# Patient Record
Sex: Female | Born: 1970 | Race: White | Hispanic: No | Marital: Married | Smoking: Never smoker
Health system: Southern US, Community
[De-identification: ages and names within clinical notes are randomized; demographics above are authoritative.]

## PROBLEM LIST (undated history)

## (undated) DIAGNOSIS — D649 Anemia, unspecified: Secondary | ICD-10-CM

## (undated) DIAGNOSIS — F419 Anxiety disorder, unspecified: Secondary | ICD-10-CM

## (undated) DIAGNOSIS — T7840XA Allergy, unspecified, initial encounter: Secondary | ICD-10-CM

## (undated) HISTORY — PX: POLYPECTOMY: SHX149

## (undated) HISTORY — PX: APPENDECTOMY: SHX54

## (undated) HISTORY — DX: Anemia, unspecified: D64.9

## (undated) HISTORY — DX: Allergy, unspecified, initial encounter: T78.40XA

## (undated) HISTORY — PX: DILATION AND CURETTAGE, DIAGNOSTIC / THERAPEUTIC: SUR384

## (undated) HISTORY — DX: Anxiety disorder, unspecified: F41.9

## (undated) HISTORY — PX: UPPER GASTROINTESTINAL ENDOSCOPY: SHX188

## (undated) HISTORY — PX: COLONOSCOPY: SHX174

---

## 2019-11-06 ENCOUNTER — Encounter: Payer: Self-pay | Admitting: Gastroenterology

## 2019-12-02 ENCOUNTER — Other Ambulatory Visit: Payer: Self-pay

## 2019-12-02 ENCOUNTER — Ambulatory Visit (AMBULATORY_SURGERY_CENTER): Payer: Self-pay

## 2019-12-02 VITALS — Ht 66.0 in | Wt 167.0 lb

## 2019-12-02 DIAGNOSIS — Z1211 Encounter for screening for malignant neoplasm of colon: Secondary | ICD-10-CM

## 2019-12-02 NOTE — Progress Notes (Signed)
No allergies to soy or egg Pt is not on blood thinners or diet pills Denies issues with sedation/intubation Denies atrial flutter/fib Denies constipation   Pt is aware of Covid safety and care partner requirements.      

## 2019-12-17 ENCOUNTER — Encounter: Payer: Self-pay | Admitting: Internal Medicine

## 2019-12-29 ENCOUNTER — Encounter: Payer: Self-pay | Admitting: Gastroenterology

## 2019-12-29 ENCOUNTER — Ambulatory Visit (AMBULATORY_SURGERY_CENTER): Payer: BC Managed Care – PPO | Admitting: Gastroenterology

## 2019-12-29 ENCOUNTER — Other Ambulatory Visit: Payer: Self-pay

## 2019-12-29 VITALS — BP 103/65 | HR 64 | Temp 97.5°F | Resp 16 | Ht 66.0 in | Wt 167.0 lb

## 2019-12-29 DIAGNOSIS — Z1211 Encounter for screening for malignant neoplasm of colon: Secondary | ICD-10-CM | POA: Diagnosis present

## 2019-12-29 DIAGNOSIS — K635 Polyp of colon: Secondary | ICD-10-CM | POA: Diagnosis not present

## 2019-12-29 DIAGNOSIS — D125 Benign neoplasm of sigmoid colon: Secondary | ICD-10-CM | POA: Diagnosis not present

## 2019-12-29 DIAGNOSIS — D120 Benign neoplasm of cecum: Secondary | ICD-10-CM

## 2019-12-29 MED ORDER — SODIUM CHLORIDE 0.9 % IV SOLN: 500.0000 mL | Freq: Once | INTRAVENOUS | Status: DC

## 2019-12-29 NOTE — Op Note (Signed)
[facility] Patient Name: Jackie Pollard Procedure Date: 12/29/2019 10:32 AM MRN: [id number] Endoscopist: Justice Britain , MD Age: 49 Referring MD:  Date of Birth: 1970-09-23 Gender: Female Account #: [id number] Procedure:                Colonoscopy Indications:              Screening for colorectal malignant neoplasm (PGM                            with early colon cancer - only 1 second degree                            relative), This is the patient's first colonoscopy Medicines:                Monitored Anesthesia Care Procedure:                Pre-Anesthesia Assessment:                           - Prior to the procedure, a History and Physical                            was performed, and patient medications and                            allergies were reviewed. The patient's tolerance of                            previous anesthesia was also reviewed. The risks                            and benefits of the procedure and the sedation                            options and risks were discussed with the patient.                            All questions were answered, and informed consent                            was obtained. Prior Anticoagulants: The patient has                            taken no previous anticoagulant or antiplatelet                            agents except for NSAID medication. ASA Grade                            Assessment: II - A patient with mild systemic                            disease. After reviewing the risks and benefits,  the patient was deemed in satisfactory condition to                            undergo the procedure.                           After obtaining informed consent, the colonoscope                            was passed under direct vision. Throughout the                            procedure, the patient's blood pressure, pulse, and                            oxygen saturations were  monitored continuously. The                            Olympus PFC-[id number] (#[id number]) Colonoscope was                            introduced through the anus and advanced to the the                            cecum, identified by appendiceal orifice and                            ileocecal valve. The colonoscopy was performed                            without difficulty. The patient tolerated the                            procedure. The quality of the bowel preparation was                            adequate. The ileocecal valve, appendiceal orifice,                            and rectum were photographed. Scope In: 11:41:02 AM Scope Out: 12:05:06 PM Scope Withdrawal Time: 0 hours 17 minutes 16 seconds  Total Procedure Duration: 0 hours 24 minutes 4 seconds  Findings:                 The digital rectal exam findings include                            hemorrhoids. Pertinent negatives include no                            palpable rectal lesions.                           A moderate amount of semi-liquid stool was found in  the entire colon, interfering with visualization.                            Lavage of the area was performed using copious                            amounts, resulting in clearance with adequate                            visualization.                           A 20 mm polyp was found in the cecum with a mucous                            cap. The polyp was sessile. The polyp was removed                            with a piecemeal technique using a cold snare.                            Resection and retrieval were complete.                           A 8 mm polyp was found in the sigmoid colon. The                            polyp was sessile. The polyp was removed with a                            cold snare. Resection and retrieval were complete.                           Normal mucosa was found in the entire colon                             otherwise.                           Non-bleeding non-thrombosed external and internal                            hemorrhoids were found during retroflexion, during                            perianal exam and during digital exam. The                            hemorrhoids were Grade II (internal hemorrhoids                            that prolapse but reduce spontaneously). Complications:            No immediate complications. Estimated Blood Loss:     Estimated blood  loss was minimal. Impression:               - Hemorrhoids found on digital rectal exam.                           - Stool in the entire examined colon. Lavaged                            copiously with adequate visualization.                           - One 20 mm polyp in the cecum, removed piecemeal                            using a cold snare. Resected and retrieved.                           - One 8 mm polyp in the sigmoid colon, removed with                            a cold snare. Resected and retrieved.                           - Normal mucosa in the entire examined colon                            otherwise.                           - Non-bleeding non-thrombosed external and internal                            hemorrhoids. Recommendation:           - The patient will be observed post-procedure,                            until all discharge criteria are met.                           - Discharge patient to home.                           - Patient has a contact number available for                            emergencies. The signs and symptoms of potential                            delayed complications were discussed with the                            patient. Return to normal activities tomorrow.                            Written discharge instructions were provided  to the                            patient.                           - High fiber diet.                           - Use FiberCon 1-2 tablets PO  daily.                           - Continue present medications.                           - Await pathology results.                           - Repeat colonoscopy in 1 year for surveillance                            after piecemeal polypectomy of larger cecal polyp.                           - The findings and recommendations were discussed                            with the patient.                           - The findings and recommendations were discussed                            with the patient's family. Justice Britain, MD 12/29/2019 12:12:25 PM

## 2019-12-29 NOTE — Progress Notes (Signed)
Called to room to assist during endoscopic procedure.  Patient ID and intended procedure confirmed with present staff. Received instructions for my participation in the procedure from the performing physician  .Patient consents to observer being present for procedure.   Victoria McReynolds, student 

## 2019-12-29 NOTE — Patient Instructions (Signed)
Handouts provided:  Polyps and High Fiber Diet  Use FiberCon 1-2 tablets by mouth daily   YOU HAD AN ENDOSCOPIC PROCEDURE TODAY AT THE [facility]:   Refer to the procedure report that was given to you for any specific questions about what was found during the examination.  If the procedure report does not answer your questions, please call your gastroenterologist to clarify.  If you requested that your care partner not be given the details of your procedure findings, then the procedure report has been included in a sealed envelope for you to review at your convenience later.  YOU SHOULD EXPECT: Some feelings of bloating in the abdomen. Passage of more gas than usual.  Walking can help get rid of the air that was put into your GI tract during the procedure and reduce the bloating. If you had a lower endoscopy (such as a colonoscopy or flexible sigmoidoscopy) you may notice spotting of blood in your stool or on the toilet paper. If you underwent a bowel prep for your procedure, you may not have a normal bowel movement for a few days.  Please Note:  You might notice some irritation and congestion in your nose or some drainage.  This is from the oxygen used during your procedure.  There is no need for concern and it should clear up in a day or so.  SYMPTOMS TO REPORT IMMEDIATELY:   Following lower endoscopy (colonoscopy or flexible sigmoidoscopy):  Excessive amounts of blood in the stool  Significant tenderness or worsening of abdominal pains  Swelling of the abdomen that is new, acute  Fever of 100F or higher  For urgent or emergent issues, a gastroenterologist can be reached at any hour by calling [phone]. Do not use MyChart messaging for urgent concerns.    DIET:  We do recommend a small meal at first, but then you may proceed to your regular diet.  Drink plenty of fluids but you should avoid alcoholic beverages for 24 hours.  ACTIVITY:  You should plan to take it easy  for the rest of today and you should NOT DRIVE or use heavy machinery until tomorrow (because of the sedation medicines used during the test).    FOLLOW UP: Our staff will call the number listed on your records 48-72 hours following your procedure to check on you and address any questions or concerns that you may have regarding the information given to you following your procedure. If we do not reach you, we will leave a message.  We will attempt to reach you two times.  During this call, we will ask if you have developed any symptoms of COVID 19. If you develop any symptoms (ie: fever, flu-like symptoms, shortness of breath, cough etc.) before then, please call [phone].  If you test positive for Covid 19 in the 2 weeks post procedure, please call and report this information to [place].    If any biopsies were taken you will be contacted by phone or by letter within the next 1-3 weeks.  Please call us at [phone] if you have not heard about the biopsies in 3 weeks.    SIGNATURES/CONFIDENTIALITY: You and/or your care partner have signed paperwork which will be entered into your electronic medical record.  These signatures attest to the fact that that the information above on your After Visit Summary has been reviewed and is understood.  Full responsibility of the confidentiality of this discharge information lies with you and/or your care-partner.

## 2019-12-29 NOTE — Progress Notes (Signed)
Patient consents to observer being present for procedure.   

## 2019-12-29 NOTE — Progress Notes (Signed)
A/ox3, pleased with MAC, report to RN 

## 2019-12-29 NOTE — Progress Notes (Signed)
Pt's states no medical or surgical changes since previsit or office visit. 

## 2019-12-30 ENCOUNTER — Telehealth: Payer: Self-pay

## 2019-12-30 NOTE — Telephone Encounter (Signed)
Left message on 2nd follow up call. 

## 2019-12-30 NOTE — Telephone Encounter (Signed)
Attempted to contact patient, left message regarding follow up of procedure. LN 

## 2020-01-10 ENCOUNTER — Encounter: Payer: Self-pay | Admitting: Gastroenterology

## 2020-05-17 ENCOUNTER — Other Ambulatory Visit: Payer: Self-pay | Admitting: Family Medicine

## 2020-05-17 DIAGNOSIS — R1011 Right upper quadrant pain: Secondary | ICD-10-CM

## 2020-05-18 ENCOUNTER — Ambulatory Visit (HOSPITAL_BASED_OUTPATIENT_CLINIC_OR_DEPARTMENT_OTHER)
Admission: RE | Admit: 2020-05-18 | Discharge: 2020-05-18 | Disposition: A | Discharge status: Home / Self Care | Payer: 59 | Source: Ambulatory Visit | Attending: Family Medicine | Admitting: Family Medicine

## 2020-05-18 ENCOUNTER — Other Ambulatory Visit: Payer: Self-pay

## 2020-05-18 DIAGNOSIS — R1011 Right upper quadrant pain: Secondary | ICD-10-CM | POA: Insufficient documentation

## 2020-05-20 ENCOUNTER — Encounter: Payer: Self-pay | Admitting: Gastroenterology

## 2020-05-20 ENCOUNTER — Ambulatory Visit: Payer: 59 | Admitting: Gastroenterology

## 2020-05-20 ENCOUNTER — Other Ambulatory Visit (INDEPENDENT_AMBULATORY_CARE_PROVIDER_SITE_OTHER): Payer: 59

## 2020-05-20 VITALS — BP 110/70 | HR 72 | Ht 65.35 in | Wt 170.4 lb

## 2020-05-20 DIAGNOSIS — K5909 Other constipation: Secondary | ICD-10-CM | POA: Diagnosis not present

## 2020-05-20 DIAGNOSIS — Z86010 Personal history of colonic polyps: Secondary | ICD-10-CM

## 2020-05-20 DIAGNOSIS — Z862 Personal history of diseases of the blood and blood-forming organs and certain disorders involving the immune mechanism: Secondary | ICD-10-CM

## 2020-05-20 DIAGNOSIS — R110 Nausea: Secondary | ICD-10-CM | POA: Diagnosis not present

## 2020-05-20 DIAGNOSIS — R197 Diarrhea, unspecified: Secondary | ICD-10-CM

## 2020-05-20 DIAGNOSIS — D649 Anemia, unspecified: Secondary | ICD-10-CM

## 2020-05-20 DIAGNOSIS — R1011 Right upper quadrant pain: Secondary | ICD-10-CM | POA: Diagnosis not present

## 2020-05-20 DIAGNOSIS — Z860101 Personal history of adenomatous and serrated colon polyps: Secondary | ICD-10-CM

## 2020-05-20 LAB — COMPREHENSIVE METABOLIC PANEL
ALT: 11 U/L (ref 0–35)
AST: 15 U/L (ref 0–37)
Albumin: 4.3 g/dL (ref 3.5–5.2)
Alkaline Phosphatase: 47 U/L (ref 39–117)
BUN: 11 mg/dL (ref 6–23)
CO2: 27 mEq/L (ref 19–32)
Calcium: 9.7 mg/dL (ref 8.4–10.5)
Chloride: 101 mEq/L (ref 96–112)
Creatinine, Ser: 0.82 mg/dL (ref 0.40–1.20)
GFR: 84.66 mL/min (ref >60.00)
Glucose, Bld: 86 mg/dL (ref 70–99)
Potassium: 4.2 mEq/L (ref 3.5–5.1)
Sodium: 137 mEq/L (ref 135–145)
Total Bilirubin: 0.2 mg/dL (ref 0.2–1.2)
Total Protein: 7.5 g/dL (ref 6.0–8.3)

## 2020-05-20 LAB — LIPASE: Lipase: 25 U/L (ref 11.0–59.0)

## 2020-05-20 LAB — CBC
HCT: 40.5 % (ref 36.0–46.0)
Hemoglobin: 13.7 g/dL (ref 12.0–15.0)
MCHC: 33.8 g/dL (ref 30.0–36.0)
MCV: 86.9 fl (ref 78.0–100.0)
Platelets: 296 10*3/uL (ref 150.0–400.0)
RBC: 4.66 Mil/uL (ref 3.87–5.11)
RDW: 13.2 % (ref 11.5–15.5)
WBC: 7.7 10*3/uL (ref 4.0–10.5)

## 2020-05-20 LAB — VITAMIN B12: Vitamin B-12: 917 pg/mL — ABNORMAL HIGH (ref 211–911)

## 2020-05-20 LAB — AMYLASE: Amylase: 39 U/L (ref 27–131)

## 2020-05-20 LAB — IBC + FERRITIN
Ferritin: 13.9 ng/mL (ref 10.0–291.0)
Iron: 81 ug/dL (ref 42–145)
Saturation Ratios: 16.7 % — ABNORMAL LOW (ref 20.0–50.0)
Transferrin: 347 mg/dL (ref 212.0–360.0)

## 2020-05-20 LAB — FOLATE: Folate: >24.4 ng/mL (ref >5.9)

## 2020-05-20 MED ORDER — ESOMEPRAZOLE MAGNESIUM 40 MG PO CPDR: 40.0000 mg | DELAYED_RELEASE_CAPSULE | Freq: Every day | ORAL | 3 refills | Status: DC

## 2020-05-20 NOTE — Progress Notes (Signed)
[place] VISIT   Primary Care Provider Waldemar Dickens, MD [place] [address] [phone]  Patient Profile: Jackie Pollard is a 50 y.o. female with a pmh significant for allergies, anxiety, chronic back pain, chronic constipation, colon polyps (SSP/TA).  The patient presents to the [facility] for an evaluation and management of problem(s) noted below:  Problem List 1. RUQ abdominal pain   2. Nausea without vomiting   3. Acute diarrhea   4. Chronic constipation   5. History of anemia   6. Hx of adenomatous colonic polyps     History of Present Illness This is a patient that I met for a screening colonoscopy in December 2021.  She was found to have multiple polyps including a large SSP in the cecum removed in piecemeal fashion and a TA of the sigmoid colon.  We recommended a 1 year follow-up due to the fragmented nature of the large SSP and she will be due for that later this year.  The patient has a longstanding history of constipation.  The patient has history of NSAID use though not on a regular basis.  She had a fatty/rich meal last Friday and subsequently developed abdominal pain and nausea with some vomiting.  Vomiting has subsided but nausea has persisted on a daily basis.  Pain is sharp and stabbing.  It is aggravated by food intake.  She has not taken anything for the pain other than Zofran that was prescribed by her PCP.  No laboratories were performed when she was evaluated by PCP.  She has not been taking significant nonsteroidals over the course the last week.  Pain located in right upper quadrant and nonradiating.  She has never had an upper endoscopy.  Right upper quadrant ultrasound was recently performed couple days ago and unremarkable for gallbladder/biliary pathology.  GI Review of Systems Positive as above including bloating, early satiety, anorexia Negative for pyrosis, dysphagia,  odynophagia, melena, hematochezia  Review of Systems General: Denies fevers/chills/weight loss unintentionally (though she has not checked her weight in the last week) HEENT: Denies oral lesions Cardiovascular: Denies chest pain/palpitations Pulmonary: Denies shortness of breath Gastroenterological: See HPI Genitourinary: Denies darkened urine or hematuria Hematological: Denies easy bruising/bleeding Endocrine: Denies temperature intolerance Dermatological: Denies jaundice Psychological: Mood is anxious to get better   Medications Current Outpatient Medications  Medication Sig Dispense Refill  . diclofenac (VOLTAREN) 75 MG EC tablet Take 75 mg by mouth 2 (two) times daily as needed.    Marland Kitchen escitalopram (LEXAPRO) 10 MG tablet Take 1 tablet by mouth daily.    Marland Kitchen esomeprazole (NEXIUM) 40 MG capsule Take 1 capsule (40 mg total) by mouth daily at 12 noon. 30 capsule 3  . Fexofenadine HCl (ALLEGRA PO) Take 1 tablet by mouth daily.    . Multiple Vitamin (MULTIVITAMIN PO) Take 1 tablet by mouth daily.    Marland Kitchen NIKKI 3-0.02 MG tablet Take 1 tablet by mouth daily.    . ondansetron (ZOFRAN-ODT) 4 MG disintegrating tablet Take 1 tablet by mouth as needed.     No current facility-administered medications for this visit.    Allergies No Known Allergies  Histories History reviewed. No pertinent past medical history. Past Surgical History:  Procedure Laterality Date  . APPENDECTOMY    . DILATION AND CURETTAGE, DIAGNOSTIC / THERAPEUTIC     Social History   Socioeconomic History  . Marital status: Married    Spouse name: Not on file  .  Number of children: Not on file  . Years of education: Not on file  . Highest education level: Not on file  Occupational History  . Not on file  Tobacco Use  . Smoking status: Never Smoker  . Smokeless tobacco: Never Used  Vaping Use  . Vaping Use: Never used  Substance and Sexual Activity  . Alcohol use: Yes    Comment: occ  . Drug use: Never  .  Sexual activity: Not on file  Other Topics Concern  . Not on file  Social History Narrative  . Not on file   Social Determinants of Health   Financial Resource Strain: Not on file  Food Insecurity: Not on file  Transportation Needs: Not on file  Physical Activity: Not on file  Stress: Not on file  Social Connections: Not on file  Intimate Partner Violence: Not on file   Family History  Problem Relation Age of Onset  . Colon cancer Paternal Grandmother 38  . Colon polyps Neg Hx   . Esophageal cancer Neg Hx   . Stomach cancer Neg Hx   . Inflammatory bowel disease Neg Hx   . Liver disease Neg Hx   . Pancreatic cancer Neg Hx    I have reviewed her medical, social, and family history in detail and updated the electronic medical record as necessary.    PHYSICAL EXAMINATION  BP 110/70 (BP Location: Left Arm, Patient Position: Sitting, Cuff Size: Normal)   Pulse 72   Ht 5' 5.35" (1.66 m) Comment: height measured without shoes  Wt 170 lb 6 oz (77.3 kg)   BMI 28.05 kg/m  Wt Readings from Last 3 Encounters:  05/20/20 170 lb 6 oz (77.3 kg)  12/29/19 167 lb (75.8 kg)  12/02/19 167 lb (75.8 kg)  GEN: NAD, appears stated age, doesn't appear chronically ill PSYCH: Cooperative, without pressured speech EYE: Conjunctivae pink, sclerae anicteric ENT: MMM, without oral ulcers, no erythema or exudates noted CV: RR without R/Gs  RESP: CTAB posteriorly, without wheezing GI: NABS, soft, tenderness to palpation in right upper quadrant/mid epigastrium, volitional guarding present, no rebound, unable to appreciate a mass or megaly  MSK/EXT: No lower extremity edema SKIN: No jaundice NEURO:  Alert & Oriented x 3, no focal deficits   REVIEW OF DATA  I reviewed the following data at the time of this encounter:  GI Procedures and Studies  December 2021 colonoscopy - Hemorrhoids found on digital rectal exam. - Stool in the entire examined colon. Lavaged copiously with adequate  visualization. - One 20 mm polyp in the cecum, removed piecemeal using a cold snare. Resected and retrieved. - One 8 mm polyp in the sigmoid colon, removed with a cold snare. Resected and retrieved. - Normal mucosa in the entire examined colon otherwise. - Non-bleeding non-thrombosed external and internal hemorrhoids.  Pathology Diagnosis 1. Surgical [P], colon, cecum, polyp (1) - MULTIPLE FRAGMENTS OF SESSILE SERRATED POLYP(S) - NO HIGH GRADE DYSPLASIA OR MALIGNANCY IDENTIFIED 2. Surgical [P], colon, sigmoid, polyp (1) - TUBULAR ADENOMA (1 OF 1 FRAGMENTS) - NO HIGH GRADE DYSPLASIA OR MALIGNANCY IDENTIFIED  Laboratory Studies  Reviewed those in epic and care everywhere  Imaging Studies  Right upper quadrant ultrasound IMPRESSION: Normal right upper quadrant ultrasound.   ASSESSMENT  Ms. Goodloe is a 50 y.o. female with a pmh significant for allergies, anxiety, chronic back pain, chronic constipation, colon polyps (SSP/TA).  The patient is seen today for evaluation and management of:  1. RUQ abdominal pain   2.  Nausea without vomiting   3. Acute diarrhea   4. Chronic constipation   5. History of anemia   6. Hx of adenomatous colonic polyps    The patient is hemodynamically stable.  Clinically however she is not doing well.  Etiology of her symptoms and acuteness of symptomatology suggests some sort of viral/microbial gastroenteritis.  She is at risk of peptic ulcer disease in the setting of her not infrequent NSAID use.  Gallbladder pathology seems to be less likely though LFTs have not been obtained.  We will perform laboratory evaluation to rule out common etiologies of symptoms.  I will initiate the patient on PPI therapy in both a diagnostic and therapeutic trial.  We will tentatively schedule her for CT abdomen pelvis next week though if she improves clinically with PPI therapy then we may not need the CT scan.  Diagnostic endoscopy will likely also need to be considered pending  on how she does.  If she has severe pain and cannot tolerated and/or is not eating or drinking and having worsened issues then she will need to be evaluated in the emergency department.  All patient questions were answered to the best of my ability, and the patient agrees to the aforementioned plan of action with follow-up as indicated.   PLAN  Laboratories as outlined below Initiate Nexium 40 mg daily CT abdomen/pelvis to be scheduled for next week Diagnostic endoscopy will be considered based on how patient is doing Pain progresses significantly and cannot eat/drink then we will need to be evaluated urgency department Colonoscopy for surveillance of prior large SSP status post piecemeal resection due by December 2022   Orders Placed This Encounter  Procedures  . CT ABDOMEN PELVIS WO CONTRAST  . CBC  . Comprehensive metabolic panel  . Amylase  . Lipase  . IBC + Ferritin  . Vitamin B12  . Folate  . Tissue transglutaminase, IgA  . IgA    New Prescriptions   ESOMEPRAZOLE (NEXIUM) 40 MG CAPSULE    Take 1 capsule (40 mg total) by mouth daily at 12 noon.   Modified Medications   No medications on file    Planned Follow Up No follow-ups on file.   Total Time in Face-to-Face and in Coordination of Care for patient including independent/personal interpretation/review of prior testing, medical history, examination, medication adjustment, communicating results with the patient directly, and documentation with the EHR is 25 minutes.   Justice Britain, MD [facility] Office # [phone]

## 2020-05-20 NOTE — Patient Instructions (Signed)
Your provider has requested that you go to the basement level for lab work before leaving today. Press "B" on the elevator. The lab is located at the first door on the left as you exit the elevator.  We have sent the following medications to your pharmacy for you to pick up at your convenience: Nexium 40 mg daily.   Please contact our office via my chart message on Monday with an update on your symptoms.   You have been scheduled for a CT scan of the abdomen and pelvis at [facility] ([address]---this is in the same building as [employer]).   You are scheduled on 05/26/20 at 9:30am. You should arrive 15 minutes prior to your appointment time for registration. Please follow the written instructions below on the day of your exam:  WARNING: IF YOU ARE ALLERGIC TO IODINE/X-RAY DYE, PLEASE NOTIFY RADIOLOGY IMMEDIATELY AT [phone]! YOU WILL BE GIVEN A 13 HOUR PREMEDICATION PREP.  1) Do not eat anything after 5:30am (4 hours prior to your test) 2) You have been given 2 bottles of oral contrast to drink. The solution may taste better if refrigerated, but do NOT add ice or any other liquid to this solution. Shake well before drinking.    Drink 1 bottle of contrast @ 7:30am (2 hours prior to your exam)  Drink 1 bottle of contrast @ 8:30am (1 hour prior to your exam)  You may take any medications as prescribed with a small amount of water, if necessary. If you take any of the following medications: METFORMIN, GLUCOPHAGE, GLUCOVANCE, AVANDAMET, RIOMET, FORTAMET, [place] MET, JANUMET, GLUMETZA or METAGLIP, you MAY be asked to HOLD this medication 48 hours AFTER the exam.  The purpose of you drinking the oral contrast is to aid in the visualization of your intestinal tract. The contrast solution may cause some diarrhea. Depending on your individual set of symptoms, you may also receive an intravenous injection of x-ray contrast/dye. Plan on being at [facility] for 30  minutes or longer, depending on the type of exam you are having performed.  This test typically takes 30-45 minutes to complete.  If you have any questions regarding your exam or if you need to reschedule, you may call the CT department at [phone] between the hours of 8:00 am and 5:00 pm, Monday-Friday.  ___________________________________________________________

## 2020-05-23 ENCOUNTER — Other Ambulatory Visit: Payer: Self-pay

## 2020-05-23 DIAGNOSIS — R1011 Right upper quadrant pain: Secondary | ICD-10-CM

## 2020-05-23 DIAGNOSIS — R110 Nausea: Secondary | ICD-10-CM

## 2020-05-23 LAB — TISSUE TRANSGLUTAMINASE, IGA: (tTG) Ab, IgA: <1 U/mL

## 2020-05-23 LAB — IGA: Immunoglobulin A: 87 mg/dL (ref 47–310)

## 2020-05-23 NOTE — Telephone Encounter (Signed)
I have reviewed the patient's MyChart message. We will move forward with our planned CT abdomen/pelvis which is scheduled for later this week. We will also go ahead and order a HIDA scan to evaluate for gallbladder dyskinesia. Patient will need to come in for repeat hepatic function panel/amylase/lipase within the next 24 hours to see if any changes have occurred. We also will move forward with getting scheduled an upper endoscopy in the course of the next few weeks depending on what the findings are. Certainly if there is evidence of liver test abnormalities or pancreatitis or her imaging studies suggest a different etiology for her symptoms we may not need endoscopy but we will see.  Patty, please work on setting up the HIDA scan and the labs.  I have replied to her on my chart.  Thanks. GM

## 2020-05-24 ENCOUNTER — Other Ambulatory Visit (INDEPENDENT_AMBULATORY_CARE_PROVIDER_SITE_OTHER): Payer: 59

## 2020-05-24 DIAGNOSIS — R110 Nausea: Secondary | ICD-10-CM | POA: Diagnosis not present

## 2020-05-24 DIAGNOSIS — R1011 Right upper quadrant pain: Secondary | ICD-10-CM

## 2020-05-24 LAB — HEPATIC FUNCTION PANEL
ALT: 9 U/L (ref 0–35)
AST: 13 U/L (ref 0–37)
Albumin: 4.2 g/dL (ref 3.5–5.2)
Alkaline Phosphatase: 44 U/L (ref 39–117)
Bilirubin, Direct: 0.1 mg/dL (ref 0.0–0.3)
Total Bilirubin: 0.4 mg/dL (ref 0.2–1.2)
Total Protein: 7.2 g/dL (ref 6.0–8.3)

## 2020-05-24 LAB — LIPASE: Lipase: 31 U/L (ref 11.0–59.0)

## 2020-05-24 LAB — AMYLASE: Amylase: 38 U/L (ref 27–131)

## 2020-05-26 ENCOUNTER — Other Ambulatory Visit: Payer: Self-pay

## 2020-05-26 ENCOUNTER — Ambulatory Visit (INDEPENDENT_AMBULATORY_CARE_PROVIDER_SITE_OTHER)
Admission: RE | Admit: 2020-05-26 | Discharge: 2020-05-26 | Disposition: A | Discharge status: Home / Self Care | Payer: 59 | Source: Ambulatory Visit | Attending: Gastroenterology | Admitting: Gastroenterology

## 2020-05-26 DIAGNOSIS — R1011 Right upper quadrant pain: Secondary | ICD-10-CM

## 2020-05-26 DIAGNOSIS — R110 Nausea: Secondary | ICD-10-CM

## 2020-06-01 ENCOUNTER — Other Ambulatory Visit: Payer: Self-pay | Admitting: Family Medicine

## 2020-06-01 DIAGNOSIS — H93A1 Pulsatile tinnitus, right ear: Secondary | ICD-10-CM

## 2020-06-01 DIAGNOSIS — G441 Vascular headache, not elsewhere classified: Secondary | ICD-10-CM

## 2020-06-10 ENCOUNTER — Other Ambulatory Visit: Payer: Self-pay

## 2020-06-10 ENCOUNTER — Encounter (HOSPITAL_COMMUNITY)
Admission: RE | Admit: 2020-06-10 | Discharge: 2020-06-10 | Disposition: A | Discharge status: Home / Self Care | Payer: 59 | Source: Ambulatory Visit | Attending: Gastroenterology | Admitting: Gastroenterology

## 2020-06-10 DIAGNOSIS — R110 Nausea: Secondary | ICD-10-CM | POA: Diagnosis present

## 2020-06-10 DIAGNOSIS — R1011 Right upper quadrant pain: Secondary | ICD-10-CM | POA: Diagnosis present

## 2020-06-10 MED ORDER — TECHNETIUM TC 99M MEBROFENIN IV KIT
5.5000 | PACK | Freq: Once | INTRAVENOUS | Status: AC | PRN
  Administered 2020-06-10: 5.5 via INTRAVENOUS

## 2020-06-15 ENCOUNTER — Telehealth: Payer: Self-pay

## 2020-06-15 MED ORDER — SUCRALFATE 1 G PO TABS: 1.0000 g | ORAL_TABLET | Freq: Two times a day (BID) | ORAL | 3 refills | Status: DC

## 2020-06-15 NOTE — Telephone Encounter (Signed)
We will proceed with stopping Nexium. We will start Carafate 2-4 times daily (at least once in the morning and once at bedtime but if patient can tolerate adding at meals and bedtime then that would be ideal if possible). Proceed with scheduling EGD in the [facility] with me. Okay to use 7:30 AM slot if necessary. Thanks. GM

## 2020-06-15 NOTE — Telephone Encounter (Signed)
Spoke with patient to schedule appointment for EGD/Previsit ([place]) .  Previsit scheduled for 09/01/20 at 10:00am..  EGD scheduled for 09/15/20 at 8:30am with Dr.Mansouraty..   Thanks

## 2020-06-15 NOTE — Telephone Encounter (Signed)
We will proceed with stopping Nexium. We will start Carafate 2-4 times daily (at least once in the morning and once at bedtime but if patient can tolerate adding at meals and bedtime then that would be ideal if possible). Proceed with scheduling EGD in the [facility] with me. Okay to use 7:30 AM slot if necessary. Thanks. GM

## 2020-06-15 NOTE — Telephone Encounter (Signed)
Jackie Pollard can you please call this pt and set up EGD in the [facility] with Dr Rush Landmark as well as previsit.

## 2020-07-07 ENCOUNTER — Ambulatory Visit (AMBULATORY_SURGERY_CENTER): Payer: 59 | Admitting: *Deleted

## 2020-07-07 ENCOUNTER — Other Ambulatory Visit: Payer: Self-pay

## 2020-07-07 VITALS — Ht 65.35 in | Wt 172.5 lb

## 2020-07-07 DIAGNOSIS — R1011 Right upper quadrant pain: Secondary | ICD-10-CM

## 2020-07-07 DIAGNOSIS — R110 Nausea: Secondary | ICD-10-CM

## 2020-07-07 NOTE — Progress Notes (Signed)

## 2020-07-19 ENCOUNTER — Other Ambulatory Visit: Payer: Self-pay

## 2020-07-19 ENCOUNTER — Ambulatory Visit (AMBULATORY_SURGERY_CENTER): Payer: 59 | Admitting: Gastroenterology

## 2020-07-19 ENCOUNTER — Encounter: Payer: Self-pay | Admitting: Gastroenterology

## 2020-07-19 VITALS — BP 126/75 | HR 74 | Temp 97.3°F | Resp 15 | Ht 65.0 in | Wt 172.0 lb

## 2020-07-19 DIAGNOSIS — K2970 Gastritis, unspecified, without bleeding: Secondary | ICD-10-CM

## 2020-07-19 DIAGNOSIS — K449 Diaphragmatic hernia without obstruction or gangrene: Secondary | ICD-10-CM

## 2020-07-19 DIAGNOSIS — K259 Gastric ulcer, unspecified as acute or chronic, without hemorrhage or perforation: Secondary | ICD-10-CM

## 2020-07-19 DIAGNOSIS — R110 Nausea: Secondary | ICD-10-CM

## 2020-07-19 DIAGNOSIS — R1011 Right upper quadrant pain: Secondary | ICD-10-CM

## 2020-07-19 MED ORDER — ESOMEPRAZOLE MAGNESIUM 20 MG PO CPDR: 40.0000 mg | DELAYED_RELEASE_CAPSULE | Freq: Two times a day (BID) | ORAL | Status: AC

## 2020-07-19 MED ORDER — SODIUM CHLORIDE 0.9 % IV SOLN: 500.0000 mL | Freq: Once | INTRAVENOUS | Status: DC

## 2020-07-19 NOTE — Op Note (Signed)
[place] Patient Name: Jackie Pollard Procedure Date: 07/19/2020 9:53 AM MRN: [id number] Endoscopist: Justice Britain , MD Age: 50 Referring MD:  Date of Birth: [birth year] Gender: Female Account #: [id number] Procedure:                Upper GI endoscopy Indications:              Epigastric abdominal pain, Abdominal pain in the                            right upper quadrant, Heartburn, Failure to respond                            to medical treatment Medicines:                Monitored Anesthesia Care Procedure:                Pre-Anesthesia Assessment:                           - Prior to the procedure, a History and Physical                            was performed, and patient medications and                            allergies were reviewed. The patient's tolerance of                            previous anesthesia was also reviewed. The risks                            and benefits of the procedure and the sedation                            options and risks were discussed with the patient.                            All questions were answered, and informed consent                            was obtained. Prior Anticoagulants: The patient has                            taken no previous anticoagulant or antiplatelet                            agents. ASA Grade Assessment: II - A patient with                            mild systemic disease. After reviewing the risks                            and benefits, the patient was deemed in  satisfactory condition to undergo the procedure.                           After obtaining informed consent, the endoscope was                            passed under direct vision. Throughout the                            procedure, the patient's blood pressure, pulse, and                            oxygen saturations were monitored continuously. The                            GIF [id number] was  introduced through the                            mouth, and advanced to the second part of duodenum.                            The upper GI endoscopy was accomplished without                            difficulty. The patient tolerated the procedure. Scope In: Scope Out: Findings:                 No gross lesions were noted in the entire                            esophagus. Biopsies were taken with a cold forceps                            for histology to rule out EoE/LoE.                           The Z-line was irregular and was found 33 cm from                            the incisors.                           A 4 cm hiatal hernia was present.                           Moderate inflammation characterized by erosions,                            erythema and granularity was found in the gastric                            body and in the gastric antrum.                           No other gross  lesions were noted in the entire                            examined stomach. Biopsies were taken with a cold                            forceps for histology and Helicobacter pylori                            testing.                           No gross lesions were noted in the duodenal bulb,                            in the first portion of the duodenum and in the                            second portion of the duodenum. Biopsies were taken                            with a cold forceps for histology. Complications:            No immediate complications. Estimated Blood Loss:     Estimated blood loss was minimal. Impression:               - No gross lesions in esophagus. Biopsied.                           - Z-line irregular, 33 cm from the incisors.                           - 4 cm hiatal hernia.                           - Gastritis in distal region. No other gross                            lesions in the stomach. Biopsied.                           - No gross lesions in the duodenal  bulb, in the                            first portion of the duodenum and in the second                            portion of the duodenum. Biopsied. Recommendation:           - The patient will be observed post-procedure,                            until all discharge criteria are met.                           -  Discharge patient to home.                           - Patient has a contact number available for                            emergencies. The signs and symptoms of potential                            delayed complications were discussed with the                            patient. Return to normal activities tomorrow.                            Written discharge instructions were provided to the                            patient.                           - Resume regular diet.                           - Increase Nexium to 40 mg twice daily for next 1-2                            months.                           - Observe patient's clinical course.                           - Await pathology results.                           - Consider transition of PO Carafate to Liquid PO                            Carafate.                           - If issues persist consider attempt at [facility] use.                           - If issues persist will consider attempt at                            Neuromodulation with TCA or SSRI/SNRI.                           - The findings and recommendations were discussed                            with the patient.                           -  The findings and recommendations were discussed                            with the patient's family. Justice Britain, MD 07/19/2020 10:20:35 AM

## 2020-07-19 NOTE — Patient Instructions (Addendum)
Information on gastritis and hiatal hernia given to you today.  Await pathology results.  Increase Nexium to 40 mg twice a day for the next 1-2 months.  Consider transition  to liquid Carafate.  Resume regular diet.  YOU HAD AN ENDOSCOPIC PROCEDURE TODAY AT [facility]:   Refer to the procedure report that was given to you for any specific questions about what was found during the examination.  If the procedure report does not answer your questions, please call your gastroenterologist to clarify.  If you requested that your care partner not be given the details of your procedure findings, then the procedure report has been included in a sealed envelope for you to review at your convenience later.  YOU SHOULD EXPECT: Some feelings of bloating in the abdomen. Passage of more gas than usual.  Walking can help get rid of the air that was put into your GI tract during the procedure and reduce the bloating. If you had a lower endoscopy (such as a colonoscopy or flexible sigmoidoscopy) you may notice spotting of blood in your stool or on the toilet paper. If you underwent a bowel prep for your procedure, you may not have a normal bowel movement for a few days.  Please Note:  You might notice some irritation and congestion in your nose or some drainage.  This is from the oxygen used during your procedure.  There is no need for concern and it should clear up in a day or so.  SYMPTOMS TO REPORT IMMEDIATELY:   Following upper endoscopy (EGD)  Vomiting of blood or coffee ground material  New chest pain or pain under the shoulder blades  Painful or persistently difficult swallowing  New shortness of breath  Fever of 100F or higher  Black, tarry-looking stools  For urgent or emergent issues, a gastroenterologist can be reached at any hour by calling [phone]. Do not use MyChart messaging for urgent concerns.    DIET:  We do recommend a small meal at first, but then you may  proceed to your regular diet.  Drink plenty of fluids but you should avoid alcoholic beverages for 24 hours.  ACTIVITY:  You should plan to take it easy for the rest of today and you should NOT DRIVE or use heavy machinery until tomorrow (because of the sedation medicines used during the test).    FOLLOW UP: Our staff will call the number listed on your records 48-72 hours following your procedure to check on you and address any questions or concerns that you may have regarding the information given to you following your procedure. If we do not reach you, we will leave a message.  We will attempt to reach you two times.  During this call, we will ask if you have developed any symptoms of COVID 19. If you develop any symptoms (ie: fever, flu-like symptoms, shortness of breath, cough etc.) before then, please call [phone].  If you test positive for Covid 19 in the 2 weeks post procedure, please call and report this information to [place].    If any biopsies were taken you will be contacted by phone or by letter within the next 1-3 weeks.  Please call us at [phone] if you have not heard about the biopsies in 3 weeks.    SIGNATURES/CONFIDENTIALITY: You and/or your care partner have signed paperwork which will be entered into your electronic medical record.  These signatures attest to the fact that that the information above on  your After Visit Summary has been reviewed and is understood.  Full responsibility of the confidentiality of this discharge information lies with you and/or your care-partner.

## 2020-07-19 NOTE — Progress Notes (Signed)
Called to room to assist during endoscopic procedure.  Patient ID and intended procedure confirmed with present staff. Received instructions for my participation in the procedure from the performing physician.  

## 2020-07-19 NOTE — Progress Notes (Signed)
Medical history reviewed with no changes noted. VS assessed by C.W 

## 2020-07-19 NOTE — Progress Notes (Signed)
1004 Robinul 0.1 mg IV given due large amount of secretions upon assessment.  MD made aware, vss 

## 2020-07-19 NOTE — Progress Notes (Signed)
Report given to PACU, vss 

## 2020-07-21 ENCOUNTER — Telehealth: Payer: Self-pay

## 2020-07-21 ENCOUNTER — Telehealth: Payer: Self-pay | Admitting: *Deleted

## 2020-07-21 NOTE — Telephone Encounter (Signed)
Left message on f/u call 

## 2020-07-21 NOTE — Telephone Encounter (Signed)
  Follow up Call-  Call back number 07/19/2020 12/29/2019  Post procedure Call Back phone  # [phone]  Permission to leave phone message Yes Yes     Patient questions:  Do you have a fever, pain , or abdominal swelling? No. Pain Score  0 *  Have you tolerated food without any problems? Yes.    Have you been able to return to your normal activities? Yes.    Do you have any questions about your discharge instructions: Diet   No. Medications  No. Follow up visit  No.  Do you have questions or concerns about your Care? No.  Actions: * If pain score is 4 or above: No action needed, pain <4.  Have you developed a fever since your procedure? no  2.   Have you had an respiratory symptoms (SOB or cough) since your procedure? no  3.   Have you tested positive for COVID 19 since your procedure no  4.   Have you had any family members/close contacts diagnosed with the COVID 19 since your procedure?  no   If yes to any of these questions please route to Joylene John, RN and Joella Prince, RN

## 2020-07-24 ENCOUNTER — Encounter: Payer: Self-pay | Admitting: Gastroenterology

## 2020-08-03 ENCOUNTER — Encounter: Payer: 59 | Admitting: Gastroenterology

## 2020-08-26 ENCOUNTER — Other Ambulatory Visit: Payer: Self-pay

## 2020-08-26 MED ORDER — ESOMEPRAZOLE MAGNESIUM 40 MG PO CPDR: 40.0000 mg | DELAYED_RELEASE_CAPSULE | Freq: Every day | ORAL | 3 refills | Status: DC

## 2020-09-15 ENCOUNTER — Encounter: Payer: 59 | Admitting: Gastroenterology

## 2020-10-26 ENCOUNTER — Other Ambulatory Visit: Payer: Self-pay | Admitting: Internal Medicine

## 2020-10-26 DIAGNOSIS — R600 Localized edema: Secondary | ICD-10-CM

## 2020-11-03 ENCOUNTER — Ambulatory Visit (HOSPITAL_COMMUNITY)
Admission: RE | Admit: 2020-11-03 | Discharge: 2020-11-03 | Disposition: A | Discharge status: Home / Self Care | Payer: 59 | Source: Ambulatory Visit | Attending: Internal Medicine | Admitting: Internal Medicine

## 2020-11-03 DIAGNOSIS — R600 Localized edema: Secondary | ICD-10-CM | POA: Insufficient documentation

## 2020-11-03 LAB — ECHOCARDIOGRAM COMPLETE
AV Mean grad: 4 mmHg
AV Peak grad: 7 mmHg
Ao pk vel: 1.32 m/s
Area-P 1/2: 3.28 cm2
Calc EF: 56.8 %
S' Lateral: 2.8 cm
Single Plane A2C EF: 57.9 %
Single Plane A4C EF: 55.2 %

## 2020-11-23 ENCOUNTER — Encounter: Payer: Self-pay | Admitting: Gastroenterology

## 2020-11-23 ENCOUNTER — Ambulatory Visit (AMBULATORY_SURGERY_CENTER): Payer: 59

## 2020-11-23 ENCOUNTER — Other Ambulatory Visit: Payer: Self-pay

## 2020-11-23 VITALS — Ht 65.0 in | Wt 180.0 lb

## 2020-11-23 DIAGNOSIS — Z86010 Personal history of colonic polyps: Secondary | ICD-10-CM

## 2020-11-23 MED ORDER — PEG 3350-KCL-NA BICARB-NACL 420 G PO SOLR: 4000.0000 mL | Freq: Once | ORAL | 0 refills | Status: AC

## 2020-11-23 NOTE — Progress Notes (Signed)
Patient's pre-visit was done today over the phone with the patient. Name,DOB and address verified. Patient denies any allergies to Eggs and Soy. Patient denies any problems with anesthesia/sedation. Patient is not taking any diet pills or blood thinners. No home Oxygen. Packet of Prep instructions mailed to patient including a copy of a consent form-pt is aware. Patient understands to call us back with any questions or concerns. Patient   The patient is COVID-19 vaccinated.

## 2020-12-12 ENCOUNTER — Other Ambulatory Visit: Payer: Self-pay | Admitting: Obstetrics & Gynecology

## 2020-12-12 DIAGNOSIS — R928 Other abnormal and inconclusive findings on diagnostic imaging of breast: Secondary | ICD-10-CM

## 2020-12-13 ENCOUNTER — Ambulatory Visit (AMBULATORY_SURGERY_CENTER): Payer: 59 | Admitting: Gastroenterology

## 2020-12-13 ENCOUNTER — Other Ambulatory Visit: Payer: Self-pay

## 2020-12-13 ENCOUNTER — Encounter: Payer: Self-pay | Admitting: Gastroenterology

## 2020-12-13 VITALS — BP 93/41 | HR 56 | Temp 96.9°F | Resp 16 | Ht 65.0 in | Wt 180.0 lb

## 2020-12-13 DIAGNOSIS — D122 Benign neoplasm of ascending colon: Secondary | ICD-10-CM

## 2020-12-13 DIAGNOSIS — Z86010 Personal history of colonic polyps: Secondary | ICD-10-CM | POA: Diagnosis present

## 2020-12-13 DIAGNOSIS — D128 Benign neoplasm of rectum: Secondary | ICD-10-CM | POA: Diagnosis not present

## 2020-12-13 DIAGNOSIS — D124 Benign neoplasm of descending colon: Secondary | ICD-10-CM

## 2020-12-13 MED ORDER — SODIUM CHLORIDE 0.9 % IV SOLN: 500.0000 mL | Freq: Once | INTRAVENOUS | Status: DC

## 2020-12-13 NOTE — Progress Notes (Signed)
Pt's states no medical or surgical changes since previsit or office visit. 

## 2020-12-13 NOTE — Progress Notes (Signed)
GASTROENTEROLOGY PROCEDURE H&P NOTE   Primary Care Physician: Waldemar Dickens, MD  HPI: Jackie Pollard is a 50 y.o. female who presents for Colonoscopy for surveillance of piecemeal SSP resection of the cecum in 2021.  Past Medical History:  Diagnosis Date   Allergy    Anemia    Anxiety    Past Surgical History:  Procedure Laterality Date   APPENDECTOMY     COLONOSCOPY     DILATION AND CURETTAGE, DIAGNOSTIC / THERAPEUTIC     POLYPECTOMY     UPPER GASTROINTESTINAL ENDOSCOPY     Current Outpatient Medications  Medication Sig Dispense Refill   Cholecalciferol (VITAMIN D3) 50 MCG (2000 UT) TABS Vitamin D3     diclofenac (VOLTAREN) 75 MG EC tablet diclofenac sodium 75 mg tablet,delayed release  TAKE 1 TABLET BY MOUTH TWICE A DAY AS NEEDED (Patient not taking: Reported on 11/23/2020)     escitalopram (LEXAPRO) 10 MG tablet Take 1 tablet by mouth daily.     esomeprazole (NEXIUM) 40 MG capsule Take 1 capsule (40 mg total) by mouth daily at 12 noon. (Patient not taking: Reported on 11/23/2020) 30 capsule 3   ferrous sulfate 325 (65 FE) MG EC tablet Take 325 mg by mouth daily. Prn heavy cycle     Fexofenadine HCl (ALLEGRA PO) Take 1 tablet by mouth daily. 180 mg     hydrochlorothiazide (HYDRODIURIL) 12.5 MG tablet 12.5 mg daily.     Multiple Vitamin (MULTIVITAMIN PO) Take 1 tablet by mouth daily.     ondansetron (ZOFRAN-ODT) 4 MG disintegrating tablet Take 1 tablet by mouth as needed. (Patient not taking: Reported on 07/07/2020)     VITAMIN C, CALCIUM ASCORBATE, PO 500 mg.     No current facility-administered medications for this visit.    Current Outpatient Medications:    Cholecalciferol (VITAMIN D3) 50 MCG (2000 UT) TABS, Vitamin D3, Disp: , Rfl:    diclofenac (VOLTAREN) 75 MG EC tablet, diclofenac sodium 75 mg tablet,delayed release  TAKE 1 TABLET BY MOUTH TWICE A DAY AS NEEDED (Patient not taking: Reported on 11/23/2020), Disp: , Rfl:    escitalopram (LEXAPRO) 10 MG  tablet, Take 1 tablet by mouth daily., Disp: , Rfl:    esomeprazole (NEXIUM) 40 MG capsule, Take 1 capsule (40 mg total) by mouth daily at 12 noon. (Patient not taking: Reported on 11/23/2020), Disp: 30 capsule, Rfl: 3   ferrous sulfate 325 (65 FE) MG EC tablet, Take 325 mg by mouth daily. Prn heavy cycle, Disp: , Rfl:    Fexofenadine HCl (ALLEGRA PO), Take 1 tablet by mouth daily. 180 mg, Disp: , Rfl:    hydrochlorothiazide (HYDRODIURIL) 12.5 MG tablet, 12.5 mg daily., Disp: , Rfl:    Multiple Vitamin (MULTIVITAMIN PO), Take 1 tablet by mouth daily., Disp: , Rfl:    ondansetron (ZOFRAN-ODT) 4 MG disintegrating tablet, Take 1 tablet by mouth as needed. (Patient not taking: Reported on 07/07/2020), Disp: , Rfl:    VITAMIN C, CALCIUM ASCORBATE, PO, 500 mg., Disp: , Rfl:  Allergies  Allergen Reactions   Codeine Nausea And Vomiting    N/V    Family History  Problem Relation Age of Onset   Colon cancer Paternal Grandmother 59   Colon polyps Neg Hx    Esophageal cancer Neg Hx    Stomach cancer Neg Hx    Inflammatory bowel disease Neg Hx    Liver disease Neg Hx    Pancreatic cancer Neg Hx    Rectal cancer Neg  Hx    Social History   Socioeconomic History   Marital status: Married    Spouse name: Not on file   Number of children: Not on file   Years of education: Not on file   Highest education level: Not on file  Occupational History   Not on file  Tobacco Use   Smoking status: Never   Smokeless tobacco: Never  Vaping Use   Vaping Use: Never used  Substance and Sexual Activity   Alcohol use: Yes    Comment: occ   Drug use: Never   Sexual activity: Not on file    Comment: peri menopausal  Other Topics Concern   Not on file  Social History Narrative   Not on file   Social Determinants of Health   Financial Resource Strain: Not on file  Food Insecurity: Not on file  Transportation Needs: Not on file  Physical Activity: Not on file  Stress: Not on file  Social Connections:  Not on file  Intimate Partner Violence: Not on file    Physical Exam: There were no vitals filed for this visit. There is no height or weight on file to calculate BMI. GEN: NAD EYE: Sclerae anicteric ENT: MMM CV: Non-tachycardic GI: Soft, NT/ND NEURO:  Alert & Oriented x 3  Lab Results: No results for input(s): WBC, HGB, HCT, PLT in the last 72 hours. BMET No results for input(s): NA, K, CL, CO2, GLUCOSE, BUN, CREATININE, CALCIUM in the last 72 hours. LFT No results for input(s): PROT, ALBUMIN, AST, ALT, ALKPHOS, BILITOT, BILIDIR, IBILI in the last 72 hours. PT/INR No results for input(s): LABPROT, INR in the last 72 hours.   Impression / Plan: This is a 50 y.o.female who presents for Colonoscopy for surveillance of piecemeal SSP resection of the cecum in 2021.  The risks and benefits of endoscopic evaluation/treatment were discussed with the patient and/or family; these include but are not limited to the risk of perforation, infection, bleeding, missed lesions, lack of diagnosis, severe illness requiring hospitalization, as well as anesthesia and sedation related illnesses.  The patient's history has been reviewed, patient examined, no change in status, and deemed stable for procedure.  The patient and/or family is agreeable to proceed.    Justice Britain, MD [facility] Office # [phone]

## 2020-12-13 NOTE — Progress Notes (Signed)
Called to room to assist during endoscopic procedure.  Patient ID and intended procedure confirmed with present staff. Received instructions for my participation in the procedure from the performing physician.  

## 2020-12-13 NOTE — Op Note (Signed)
[place] Patient Name: Jackie Pollard Procedure Date: 12/13/2020 10:47 AM MRN: [id number] Endoscopist: Justice Britain , MD Age: 50 Referring MD:  Date of Birth: 21-Jan-1970 Gender: Female Account #: [id number] Procedure:                Colonoscopy Indications:              Surveillance: Personal history of piecemeal removal                            of adenoma on last colonoscopy (less than 1 year                            ago) Medicines:                Monitored Anesthesia Care Procedure:                Pre-Anesthesia Assessment:                           - Prior to the procedure, a History and Physical                            was performed, and patient medications and                            allergies were reviewed. The patient's tolerance of                            previous anesthesia was also reviewed. The risks                            and benefits of the procedure and the sedation                            options and risks were discussed with the patient.                            All questions were answered, and informed consent                            was obtained. Prior Anticoagulants: The patient has                            taken no previous anticoagulant or antiplatelet                            agents. ASA Grade Assessment: II - A patient with                            mild systemic disease. After reviewing the risks                            and benefits, the patient was deemed in  satisfactory condition to undergo the procedure.                           After obtaining informed consent, the colonoscope                            was passed under direct vision. Throughout the                            procedure, the patient's blood pressure, pulse, and                            oxygen saturations were monitored continuously. The                            Olympus CF-HQ190L (Serial# [id number]) Colonoscope  was                            introduced through the anus and advanced to the the                            cecum, identified by the appendiceal orifice,                            ileocecal valve and palpation. The colonoscopy was                            performed without difficulty. The patient tolerated                            the procedure. The quality of the bowel preparation                            was good. The terminal ileum, ileocecal valve,                            appendiceal orifice, and rectum were photographed. Scope In: 10:58:51 AM Scope Out: 11:15:22 AM Scope Withdrawal Time: 0 hours 12 minutes 1 second  Total Procedure Duration: 0 hours 16 minutes 31 seconds  Findings:                 The digital rectal exam findings include                            hemorrhoids. Pertinent negatives include no                            palpable rectal lesions.                           The terminal ileum and ileocecal valve appeared                            normal.  A medium post mucosectomy scar was found in the                            cecum. The scar tissue was healthy in appearance.                           Four sessile polyps were found in the rectum (1),                            descending colon (2) and ascending colon (1). The                            polyps were 1 to 3 mm in size. These polyps were                            removed with a cold snare. Resection and retrieval                            were complete.                           Normal mucosa was found in the entire colon                            otherwise.                           Non-bleeding non-thrombosed external and internal                            hemorrhoids were found during retroflexion, during                            perianal exam and during digital exam. The                            hemorrhoids were Grade II (internal hemorrhoids                             that prolapse but reduce spontaneously).                           ' Complications:            No immediate complications. Estimated Blood Loss:     Estimated blood loss was minimal. Impression:               - Hemorrhoids found on digital rectal exam.                           - The examined portion of the ileum was normal.                           - Post mucosectomy scar in the cecum.                           -  Four 1 to 3 mm polyps in the rectum, in the                            descending colon and in the ascending colon,                            removed with a cold snare. Resected and retrieved.                           - Normal mucosa in the entire examined colon                            otherwise.                           - Non-bleeding non-thrombosed external and internal                            hemorrhoids. Recommendation:           - The patient will be observed post-procedure,                            until all discharge criteria are met.                           - Discharge patient to home.                           - Patient has a contact number available for                            emergencies. The signs and symptoms of potential                            delayed complications were discussed with the                            patient. Return to normal activities tomorrow.                            Written discharge instructions were provided to the                            patient.                           - High fiber diet.                           - Use FiberCon 1-2 tablets PO daily.                           - Continue present medications.                           -  Await pathology results.                           - Repeat colonoscopy in 3 years for surveillance.                           - The findings and recommendations were discussed                            with the patient.                           - The findings and  recommendations were discussed                            with the patient's family. Justice Britain, MD 12/13/2020 11:20:47 AM

## 2020-12-13 NOTE — Progress Notes (Signed)
D.T. vital signs. °

## 2020-12-13 NOTE — Patient Instructions (Addendum)
Await pathology= 4 polyps removed today  Please read over handouts about polyps, hemorrhoids and high fiber diets Continue your normal medications- use FiberCon 1-2 tablets daily Next colonoscopy- 3 years  YOU HAD AN ENDOSCOPIC PROCEDURE TODAY AT [place]:   Refer to the procedure report that was given to you for any specific questions about what was found during the examination.  If the procedure report does not answer your questions, please call your gastroenterologist to clarify.  If you requested that your care partner not be given the details of your procedure findings, then the procedure report has been included in a sealed envelope for you to review at your convenience later.  YOU SHOULD EXPECT: Some feelings of bloating in the abdomen. Passage of more gas than usual.  Walking can help get rid of the air that was put into your GI tract during the procedure and reduce the bloating. If you had a lower endoscopy (such as a colonoscopy or flexible sigmoidoscopy) you may notice spotting of blood in your stool or on the toilet paper. If you underwent a bowel prep for your procedure, you may not have a normal bowel movement for a few days.  Please Note:  You might notice some irritation and congestion in your nose or some drainage.  This is from the oxygen used during your procedure.  There is no need for concern and it should clear up in a day or so.  SYMPTOMS TO REPORT IMMEDIATELY:  Following lower endoscopy (colonoscopy or flexible sigmoidoscopy):  Excessive amounts of blood in the stool  Significant tenderness or worsening of abdominal pains  Swelling of the abdomen that is new, acute  Fever of 100F or higher  Black, tarry-looking stools  For urgent or emergent issues, a gastroenterologist can be reached at any hour by calling [phone]. Do not use MyChart messaging for urgent concerns.    DIET:  We do recommend a small meal at first, but then you may proceed to  your regular diet.  Drink plenty of fluids but you should avoid alcoholic beverages for 24 hours.  ACTIVITY:  You should plan to take it easy for the rest of today and you should NOT DRIVE or use heavy machinery until tomorrow (because of the sedation medicines used during the test).    FOLLOW UP: Our staff will call the number listed on your records 48-72 hours following your procedure to check on you and address any questions or concerns that you may have regarding the information given to you following your procedure. If we do not reach you, we will leave a message.  We will attempt to reach you two times.  During this call, we will ask if you have developed any symptoms of COVID 19. If you develop any symptoms (ie: fever, flu-like symptoms, shortness of breath, cough etc.) before then, please call [phone].  If you test positive for Covid 19 in the 2 weeks post procedure, please call and report this information to [place].    If any biopsies were taken you will be contacted by phone or by letter within the next 1-3 weeks.  Please call us at [phone] if you have not heard about the biopsies in 3 weeks.    SIGNATURES/CONFIDENTIALITY: You and/or your care partner have signed paperwork which will be entered into your electronic medical record.  These signatures attest to the fact that that the information above on your After Visit Summary has been reviewed and is understood.  Full responsibility of the confidentiality of this discharge information lies with you and/or your care-partner.

## 2020-12-13 NOTE — Progress Notes (Signed)
Report to PACU, RN, vss, BBS= Clear.  

## 2020-12-14 ENCOUNTER — Other Ambulatory Visit (HOSPITAL_COMMUNITY): Payer: 59

## 2020-12-15 ENCOUNTER — Telehealth: Payer: Self-pay

## 2020-12-15 NOTE — Telephone Encounter (Signed)
°  Follow up Call-  Call back number 12/13/2020 07/19/2020 12/29/2019  Post procedure Call Back phone  # [phone]  Permission to leave phone message Yes Yes Yes     Patient questions:  Do you have a fever, pain , or abdominal swelling? No. Pain Score  0 *  Have you tolerated food without any problems? Yes.    Have you been able to return to your normal activities? Yes.    Do you have any questions about your discharge instructions: Diet   No. Medications  No. Follow up visit  No.  Do you have questions or concerns about your Care? No.  Actions: * If pain score is 4 or above: No action needed, pain <4.  Have you developed a fever since your procedure? no  2.   Have you had an respiratory symptoms (SOB or cough) since your procedure? no  3.   Have you tested positive for COVID 19 since your procedure no  4.   Have you had any family members/close contacts diagnosed with the COVID 19 since your procedure?  no   If yes to any of these questions please route to Joylene John, RN and Joella Prince, RN

## 2020-12-16 ENCOUNTER — Ambulatory Visit
Admission: RE | Admit: 2020-12-16 | Discharge: 2020-12-16 | Disposition: A | Discharge status: Home / Self Care | Payer: 59 | Source: Ambulatory Visit | Attending: Obstetrics & Gynecology | Admitting: Obstetrics & Gynecology

## 2020-12-16 ENCOUNTER — Ambulatory Visit: Payer: 59

## 2020-12-16 DIAGNOSIS — R928 Other abnormal and inconclusive findings on diagnostic imaging of breast: Secondary | ICD-10-CM

## 2020-12-18 ENCOUNTER — Encounter: Payer: Self-pay | Admitting: Gastroenterology

## 2021-02-27 ENCOUNTER — Other Ambulatory Visit: Payer: Self-pay | Admitting: Family Medicine

## 2021-02-27 DIAGNOSIS — M542 Cervicalgia: Secondary | ICD-10-CM

## 2021-02-27 NOTE — Progress Notes (Signed)
Neck and R trap pain for several weeks. Not resolving w/ conservative measures.

## 2021-03-09 ENCOUNTER — Encounter: Payer: Self-pay | Admitting: Gastroenterology

## 2021-08-18 IMAGING — CT CT ABD-PELV W/O CM
2 of 4 series · 16 of 46 positions shown, 18 images · non-contrast
Comparison: None.

CLINICAL DATA: Right upper quadrant abdominal pain. Diarrhea. Daily
nausea intermittently since [REDACTED].

EXAM:
CT ABDOMEN AND PELVIS WITHOUT CONTRAST
TECHNIQUE: Multidetector CT imaging of the abdomen and pelvis was performed
following the standard protocol without IV contrast.

[Series 2: abd/ pelvis · axial · 0.69mm/px · z∈[-834,-429]mm · 13 of 89 slices shown, 15 images]
[im 4/89  soft-tissue]
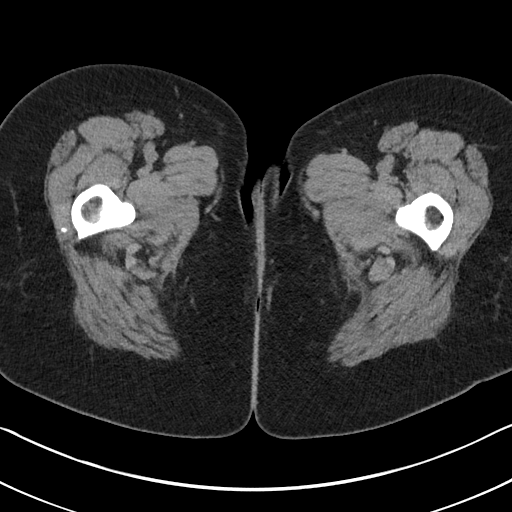
[im 4/89  bone]
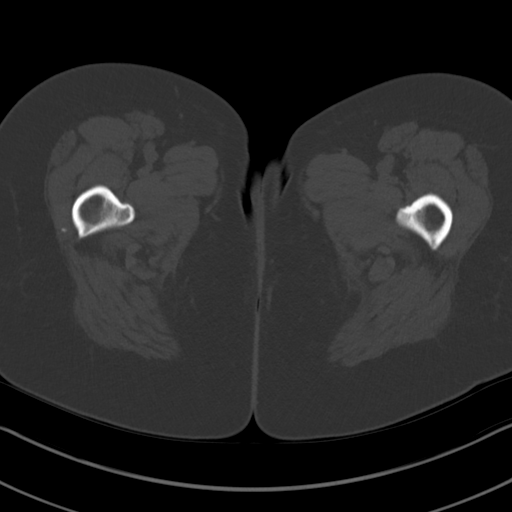
[im 11/89  soft-tissue]
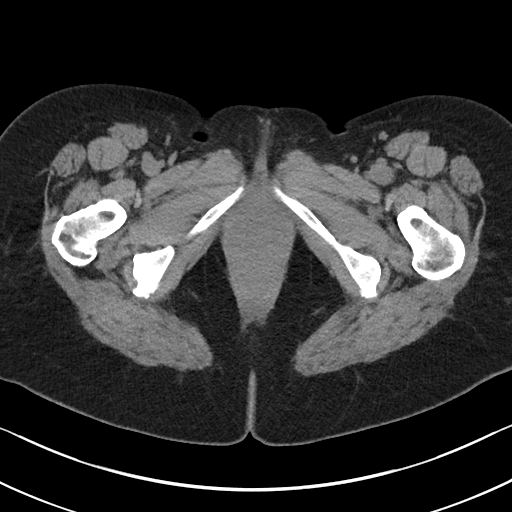
[im 17/89  soft-tissue]
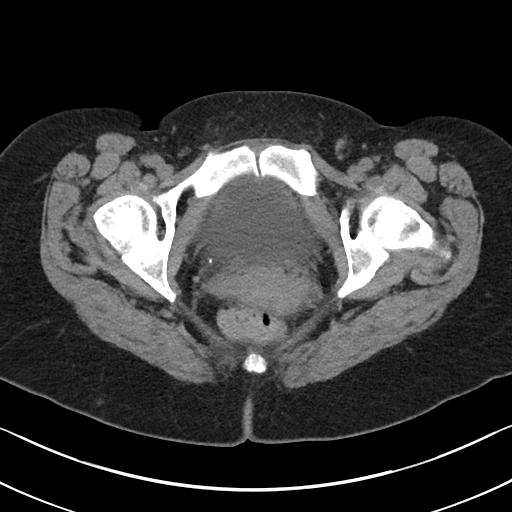
[im 24/89  soft-tissue]
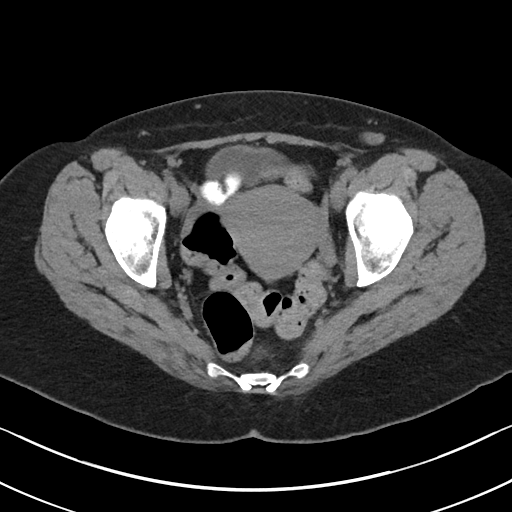
[im 31/89  soft-tissue]
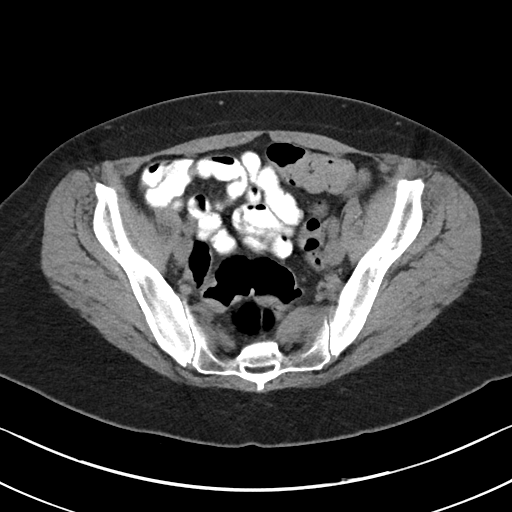
[im 38/89  soft-tissue]
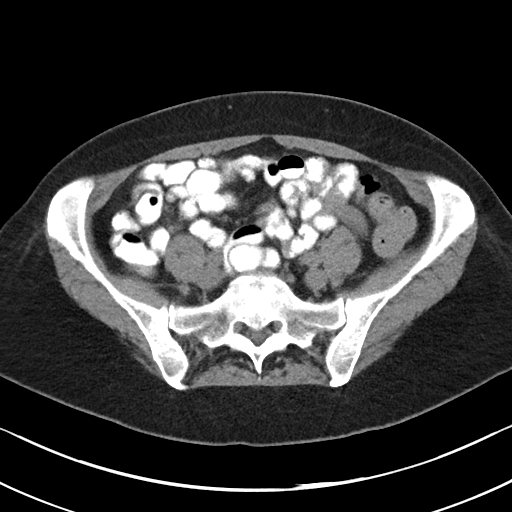
[im 45/89  soft-tissue]
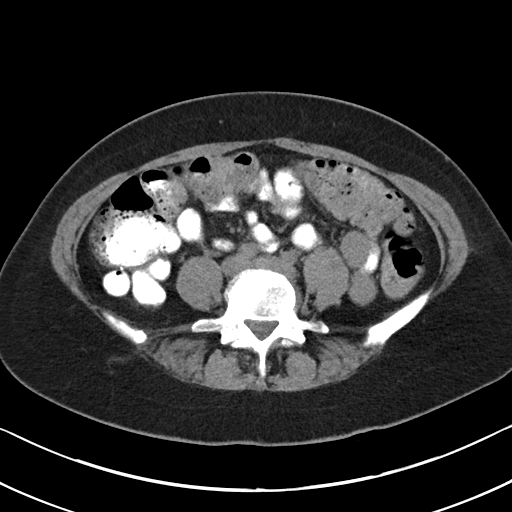
[im 51/89  soft-tissue]
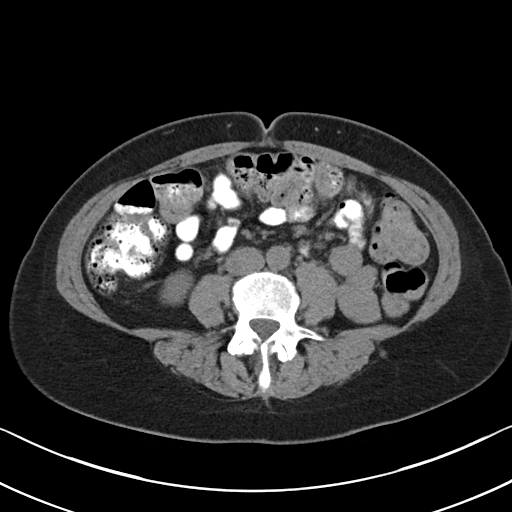
[im 58/89  soft-tissue]
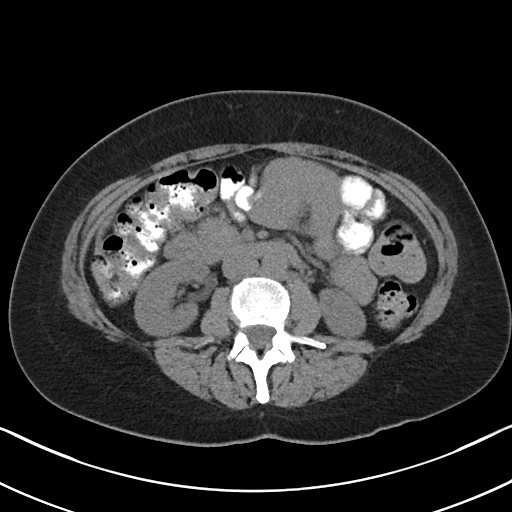
[im 58/89  bone]
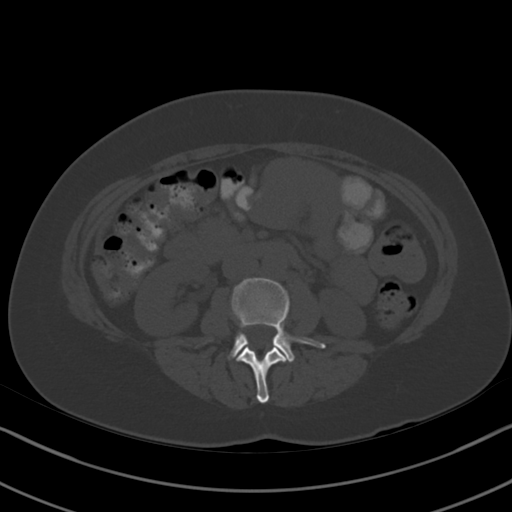
[im 65/89  soft-tissue]
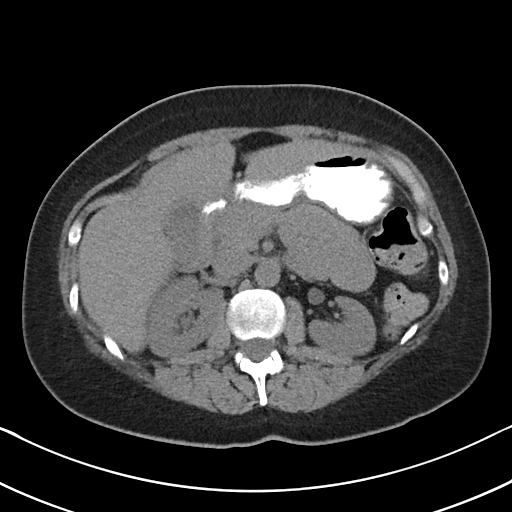
[im 72/89  soft-tissue]
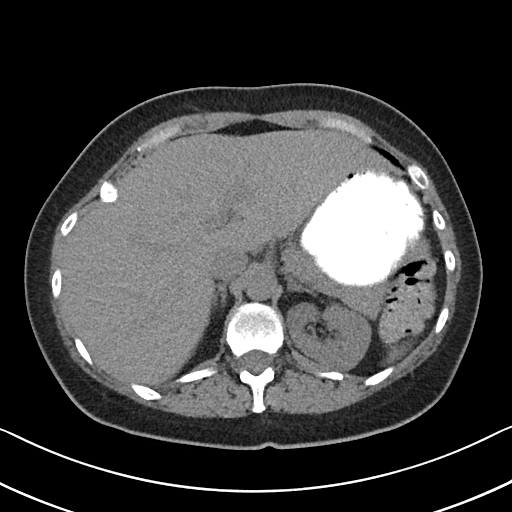
[im 78/89  soft-tissue]
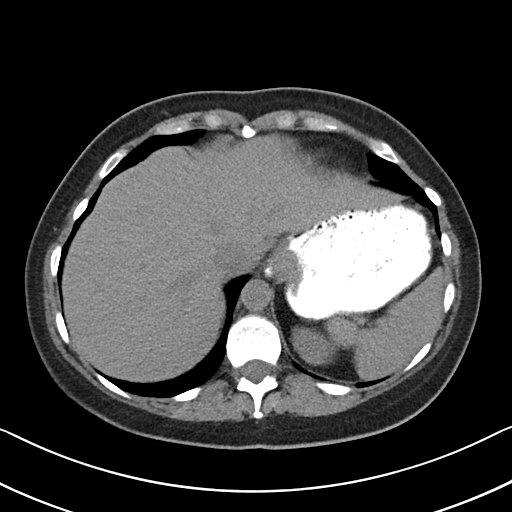
[im 85/89  soft-tissue]
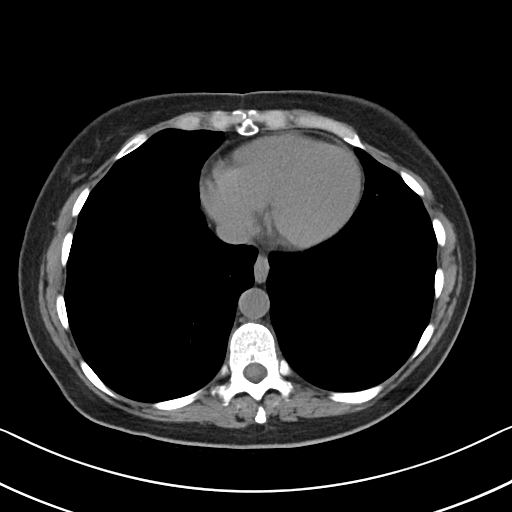

[Series 5: cor st · coronal · 0.63mm/px · 3 of 74 slices shown]
[im 25/74  soft-tissue]
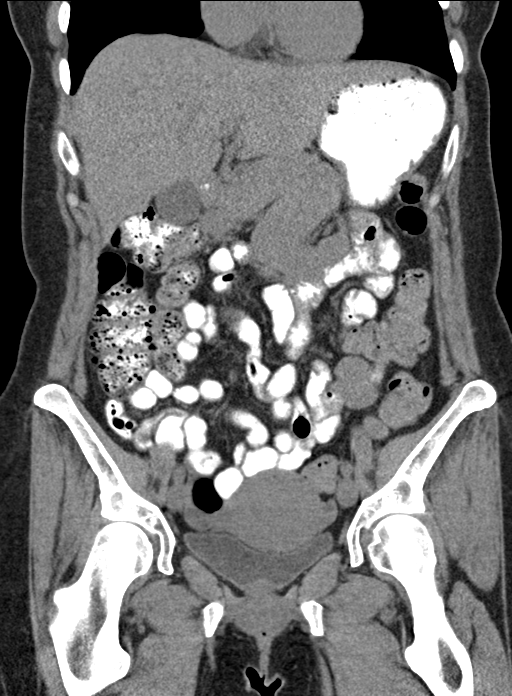
[im 33/74  soft-tissue]
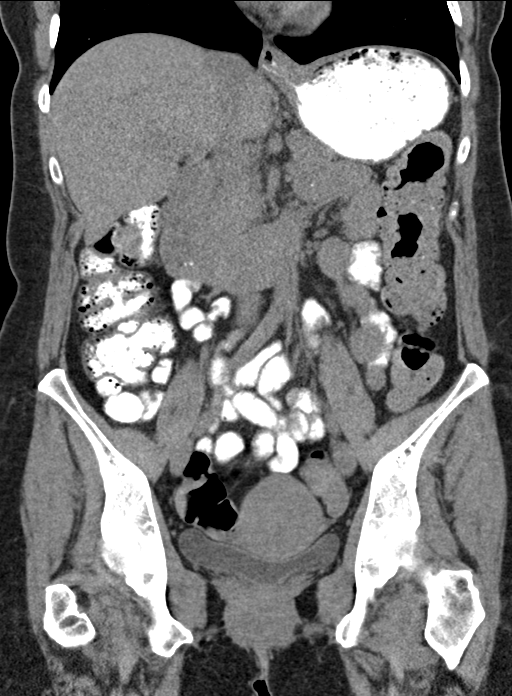
[im 41/74  soft-tissue]
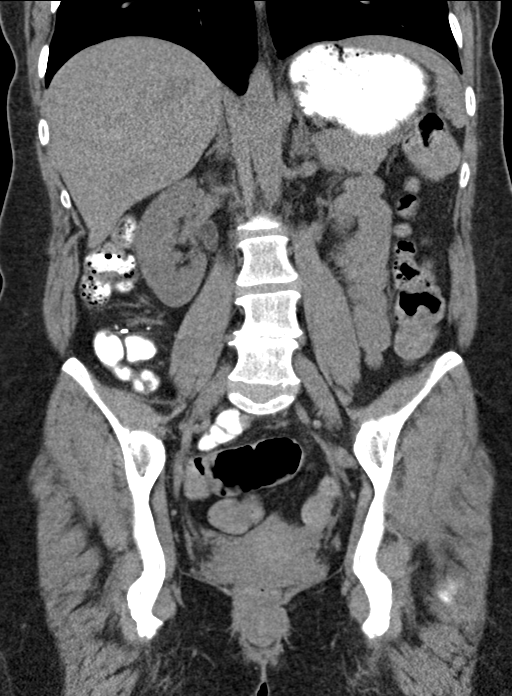

[16 of 46 positions shown; findings below may reference images not displayed]

FINDINGS: Lower chest: Chest unremarkable

Hepatobiliary: No focal abnormality in the liver on this study
without intravenous contrast. There is no evidence for gallstones,
gallbladder wall thickening, or pericholecystic fluid. No
intrahepatic or extrahepatic biliary dilation.

Pancreas: No focal mass lesion. No dilatation of the main duct. No
intraparenchymal cyst. No peripancreatic edema.

Spleen: No splenomegaly. No focal mass lesion.

Adrenals/Urinary Tract: No adrenal nodule or mass. Kidneys
unremarkable. No evidence for hydroureter. The urinary bladder
appears normal for the degree of distention.

Stomach/Bowel: Tiny hiatal hernia. Stomach is filled with contrast
material. Duodenum is normally positioned as is the ligament of
Treitz. No small bowel wall thickening. No small bowel dilatation.
The terminal ileum is normal. The appendix is not well visualized,
but there is no edema or inflammation in the region of the cecum. No
gross colonic mass. No colonic wall thickening. Moderate stool
volume.

Vascular/Lymphatic: No abdominal aortic aneurysm. No abdominal
lymphadenopathy No pelvic sidewall lymphadenopathy.

Reproductive: The uterus is unremarkable.  There is no adnexal mass.

Other: No intraperitoneal free fluid.

Musculoskeletal: No worrisome lytic or sclerotic osseous
abnormality.
IMPRESSION: 1. No acute findings in the abdomen or pelvis. Specifically, no
findings to explain the patient's history of right upper quadrant
pain.
2. Moderate stool volume. Imaging features could be compatible with
constipation in the appropriate clinical setting.
3. Tiny hiatal hernia.

## 2024-01-07 ENCOUNTER — Other Ambulatory Visit: Payer: Self-pay | Admitting: Obstetrics & Gynecology

## 2024-01-07 DIAGNOSIS — R928 Other abnormal and inconclusive findings on diagnostic imaging of breast: Secondary | ICD-10-CM

## 2024-01-14 ENCOUNTER — Ambulatory Visit
Admission: RE | Admit: 2024-01-14 | Discharge: 2024-01-14 | Disposition: A | Source: Ambulatory Visit | Attending: Obstetrics & Gynecology | Admitting: Obstetrics & Gynecology

## 2024-01-14 ENCOUNTER — Ambulatory Visit

## 2024-01-14 DIAGNOSIS — R928 Other abnormal and inconclusive findings on diagnostic imaging of breast: Secondary | ICD-10-CM
